# Patient Record
Sex: Female | Born: 2005
Health system: Southern US, Community
[De-identification: ages and names within clinical notes are randomized; demographics above are authoritative.]

## PROBLEM LIST (undated history)

## (undated) DIAGNOSIS — J45909 Unspecified asthma, uncomplicated: Secondary | ICD-10-CM

---
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
## 2024-01-01 DIAGNOSIS — F4322 Adjustment disorder with anxiety: Secondary | ICD-10-CM | POA: Diagnosis not present

## 2024-01-02 ENCOUNTER — Other Ambulatory Visit (HOSPITAL_COMMUNITY): Payer: Self-pay

## 2024-01-05 DIAGNOSIS — K5000 Crohn's disease of small intestine without complications: Secondary | ICD-10-CM | POA: Diagnosis not present

## 2024-01-08 DIAGNOSIS — K5000 Crohn's disease of small intestine without complications: Secondary | ICD-10-CM | POA: Diagnosis not present

## 2024-01-08 DIAGNOSIS — F4322 Adjustment disorder with anxiety: Secondary | ICD-10-CM | POA: Diagnosis not present

## 2024-01-15 DIAGNOSIS — F4322 Adjustment disorder with anxiety: Secondary | ICD-10-CM | POA: Diagnosis not present

## 2024-01-27 DIAGNOSIS — J301 Allergic rhinitis due to pollen: Secondary | ICD-10-CM | POA: Diagnosis not present

## 2024-01-28 DIAGNOSIS — F419 Anxiety disorder, unspecified: Secondary | ICD-10-CM | POA: Diagnosis not present

## 2024-01-29 DIAGNOSIS — J301 Allergic rhinitis due to pollen: Secondary | ICD-10-CM | POA: Diagnosis not present

## 2024-01-29 DIAGNOSIS — J3089 Other allergic rhinitis: Secondary | ICD-10-CM | POA: Diagnosis not present

## 2024-01-29 DIAGNOSIS — F4322 Adjustment disorder with anxiety: Secondary | ICD-10-CM | POA: Diagnosis not present

## 2024-01-29 DIAGNOSIS — J3081 Allergic rhinitis due to animal (cat) (dog) hair and dander: Secondary | ICD-10-CM | POA: Diagnosis not present

## 2024-02-09 DIAGNOSIS — J301 Allergic rhinitis due to pollen: Secondary | ICD-10-CM | POA: Diagnosis not present

## 2024-02-12 DIAGNOSIS — F4322 Adjustment disorder with anxiety: Secondary | ICD-10-CM | POA: Diagnosis not present

## 2024-02-16 DIAGNOSIS — J301 Allergic rhinitis due to pollen: Secondary | ICD-10-CM | POA: Diagnosis not present

## 2024-02-19 DIAGNOSIS — F4322 Adjustment disorder with anxiety: Secondary | ICD-10-CM | POA: Diagnosis not present

## 2024-03-03 DIAGNOSIS — J301 Allergic rhinitis due to pollen: Secondary | ICD-10-CM | POA: Diagnosis not present

## 2024-03-04 DIAGNOSIS — F4322 Adjustment disorder with anxiety: Secondary | ICD-10-CM | POA: Diagnosis not present

## 2024-03-10 DIAGNOSIS — K5000 Crohn's disease of small intestine without complications: Secondary | ICD-10-CM | POA: Diagnosis not present

## 2024-03-11 DIAGNOSIS — F4322 Adjustment disorder with anxiety: Secondary | ICD-10-CM | POA: Diagnosis not present

## 2024-03-18 DIAGNOSIS — F4322 Adjustment disorder with anxiety: Secondary | ICD-10-CM | POA: Diagnosis not present

## 2024-03-19 DIAGNOSIS — J301 Allergic rhinitis due to pollen: Secondary | ICD-10-CM | POA: Diagnosis not present

## 2024-03-19 DIAGNOSIS — J3089 Other allergic rhinitis: Secondary | ICD-10-CM | POA: Diagnosis not present

## 2024-03-19 DIAGNOSIS — J3081 Allergic rhinitis due to animal (cat) (dog) hair and dander: Secondary | ICD-10-CM | POA: Diagnosis not present

## 2024-03-24 DIAGNOSIS — K5000 Crohn's disease of small intestine without complications: Secondary | ICD-10-CM | POA: Diagnosis not present

## 2024-03-25 DIAGNOSIS — F4322 Adjustment disorder with anxiety: Secondary | ICD-10-CM | POA: Diagnosis not present

## 2024-04-08 DIAGNOSIS — J301 Allergic rhinitis due to pollen: Secondary | ICD-10-CM | POA: Diagnosis not present

## 2024-04-14 DIAGNOSIS — Z0000 Encounter for general adult medical examination without abnormal findings: Secondary | ICD-10-CM | POA: Diagnosis not present

## 2024-04-14 DIAGNOSIS — Z30011 Encounter for initial prescription of contraceptive pills: Secondary | ICD-10-CM | POA: Diagnosis not present

## 2024-04-14 DIAGNOSIS — Z113 Encounter for screening for infections with a predominantly sexual mode of transmission: Secondary | ICD-10-CM | POA: Diagnosis not present

## 2024-04-19 DIAGNOSIS — J301 Allergic rhinitis due to pollen: Secondary | ICD-10-CM | POA: Diagnosis not present

## 2024-04-30 DIAGNOSIS — J3081 Allergic rhinitis due to animal (cat) (dog) hair and dander: Secondary | ICD-10-CM | POA: Diagnosis not present

## 2024-04-30 DIAGNOSIS — J3089 Other allergic rhinitis: Secondary | ICD-10-CM | POA: Diagnosis not present

## 2024-04-30 DIAGNOSIS — J301 Allergic rhinitis due to pollen: Secondary | ICD-10-CM | POA: Diagnosis not present

## 2024-05-04 DIAGNOSIS — F4322 Adjustment disorder with anxiety: Secondary | ICD-10-CM | POA: Diagnosis not present

## 2024-05-20 DIAGNOSIS — L309 Dermatitis, unspecified: Secondary | ICD-10-CM | POA: Diagnosis not present

## 2024-05-20 DIAGNOSIS — J3089 Other allergic rhinitis: Secondary | ICD-10-CM | POA: Diagnosis not present

## 2024-05-20 DIAGNOSIS — J4530 Mild persistent asthma, uncomplicated: Secondary | ICD-10-CM | POA: Diagnosis not present

## 2024-05-20 DIAGNOSIS — J301 Allergic rhinitis due to pollen: Secondary | ICD-10-CM | POA: Diagnosis not present

## 2024-05-27 DIAGNOSIS — J3081 Allergic rhinitis due to animal (cat) (dog) hair and dander: Secondary | ICD-10-CM | POA: Diagnosis not present

## 2024-05-27 DIAGNOSIS — J3089 Other allergic rhinitis: Secondary | ICD-10-CM | POA: Diagnosis not present

## 2024-05-27 DIAGNOSIS — J301 Allergic rhinitis due to pollen: Secondary | ICD-10-CM | POA: Diagnosis not present

## 2024-07-06 DIAGNOSIS — K5000 Crohn's disease of small intestine without complications: Secondary | ICD-10-CM | POA: Diagnosis not present

## 2024-07-19 DIAGNOSIS — R195 Other fecal abnormalities: Secondary | ICD-10-CM | POA: Diagnosis not present

## 2024-07-19 DIAGNOSIS — K5000 Crohn's disease of small intestine without complications: Secondary | ICD-10-CM | POA: Diagnosis not present

## 2024-08-06 DIAGNOSIS — J209 Acute bronchitis, unspecified: Secondary | ICD-10-CM | POA: Diagnosis not present

## 2024-08-12 DIAGNOSIS — J301 Allergic rhinitis due to pollen: Secondary | ICD-10-CM | POA: Diagnosis not present

## 2024-08-13 DIAGNOSIS — J45901 Unspecified asthma with (acute) exacerbation: Secondary | ICD-10-CM | POA: Diagnosis not present

## 2024-08-13 DIAGNOSIS — B9689 Other specified bacterial agents as the cause of diseases classified elsewhere: Secondary | ICD-10-CM | POA: Diagnosis not present

## 2024-08-13 DIAGNOSIS — J019 Acute sinusitis, unspecified: Secondary | ICD-10-CM | POA: Diagnosis not present

## 2024-08-24 DIAGNOSIS — R058 Other specified cough: Secondary | ICD-10-CM | POA: Diagnosis not present

## 2024-08-24 DIAGNOSIS — Z3041 Encounter for surveillance of contraceptive pills: Secondary | ICD-10-CM | POA: Diagnosis not present

## 2024-08-24 DIAGNOSIS — J4530 Mild persistent asthma, uncomplicated: Secondary | ICD-10-CM | POA: Diagnosis not present

## 2024-08-24 DIAGNOSIS — R5383 Other fatigue: Secondary | ICD-10-CM | POA: Diagnosis not present

## 2024-08-30 DIAGNOSIS — J019 Acute sinusitis, unspecified: Secondary | ICD-10-CM | POA: Diagnosis not present
